# Patient Record
Sex: Female | Born: 1980 | Race: White | Hispanic: Yes | Marital: Married
Health system: Southern US, Community
[De-identification: ages and names within clinical notes are randomized; demographics above are authoritative.]

---

## 2005-04-26 ENCOUNTER — Encounter: Admission: RE | Admit: 2005-04-26 | Discharge: 2005-04-26 | Payer: Self-pay | Admitting: Internal Medicine

## 2005-05-20 ENCOUNTER — Other Ambulatory Visit: Admission: RE | Admit: 2005-05-20 | Discharge: 2005-05-20 | Payer: Self-pay | Admitting: Obstetrics & Gynecology

## 2006-01-11 ENCOUNTER — Other Ambulatory Visit: Admission: RE | Admit: 2006-01-11 | Discharge: 2006-01-11 | Payer: Self-pay | Admitting: Obstetrics & Gynecology

## 2006-05-11 IMAGING — US US PELVIS COMPLETE
1 series · 14 of 25 positions shown · non-contrast
Comparison: none

CLINICAL DATA: Pelvic pain, abdominal pain.
 TRANSABDOMINAL AND TRANSVAGINAL PELVIC ULTRASOUND:
TECHNIQUE: Both transabdominal and transvaginal ultrasound examinations of the pelvis were performed, including evaluation of the uterus, ovaries, adnexal regions, and pelvic cul-de-sac.
 The uterus is normal in size measuring 8.4 cm sagittally with a depth of 4.5 cm and width of 5.0 cm.  The endometrium is within normal limits measuring 13 mm in thickness and appearing homogeneous.  No myometrial abnormality is seen.  The ovaries are normal in size.  There is an apparent complex cyst on the right of 2.3 cm. Only trace amount of fluid is noted in the pelvis.

[Series 1: unknown · 0.22mm/px · 14 of 63 slices shown]
[im 1/63]
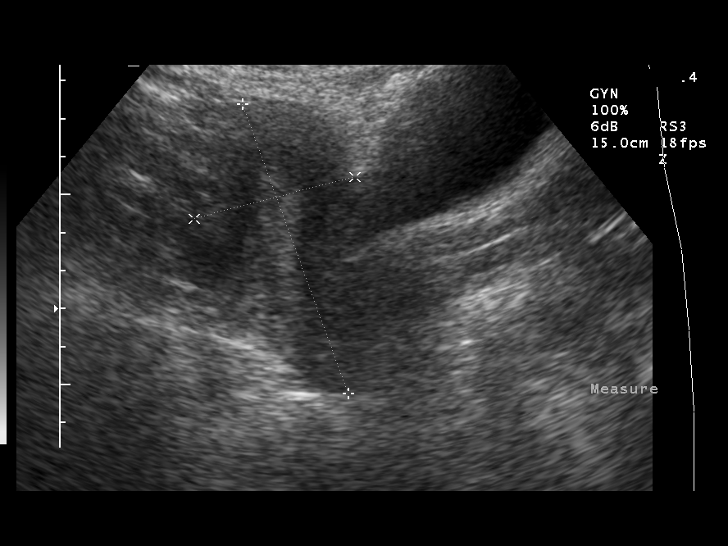
[im 6/63]
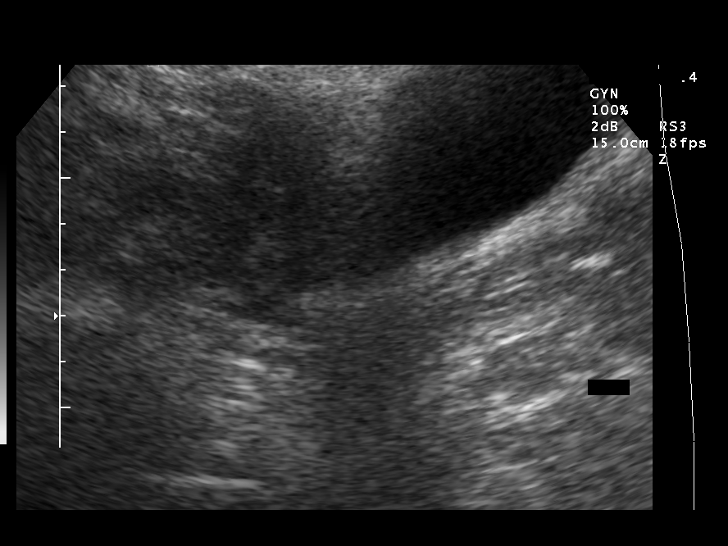
[im 11/63]
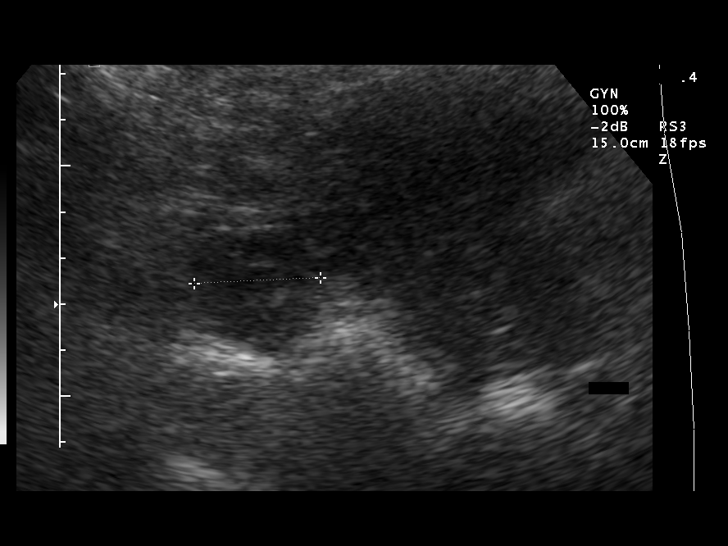
[im 16/63]
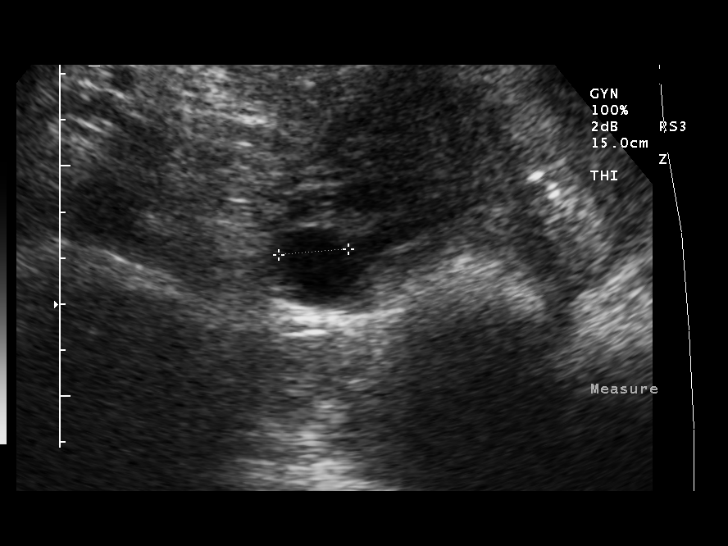
[im 21/63]
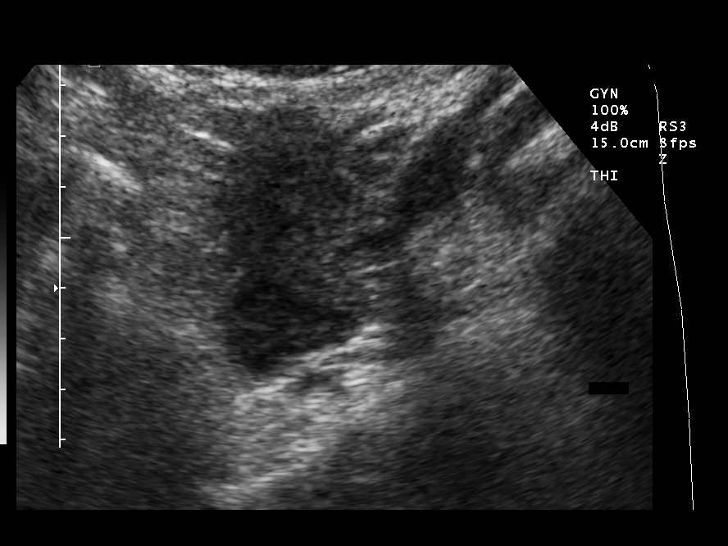
[im 24/63]
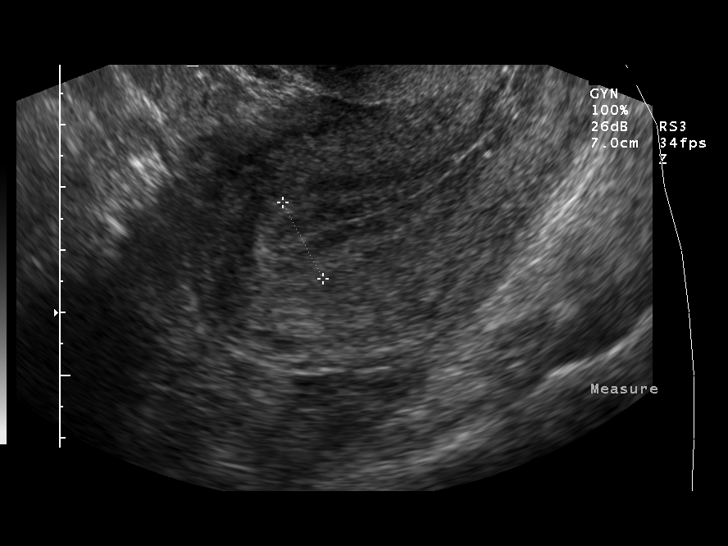
[im 29/63]
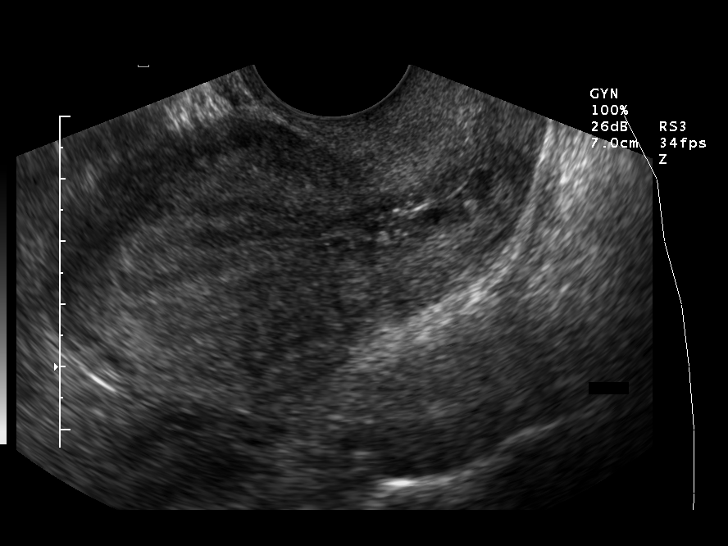
[im 34/63]
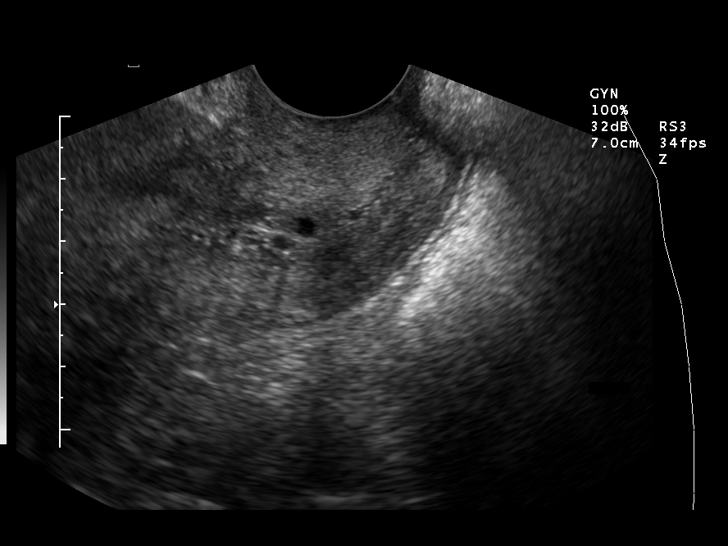
[im 39/63]
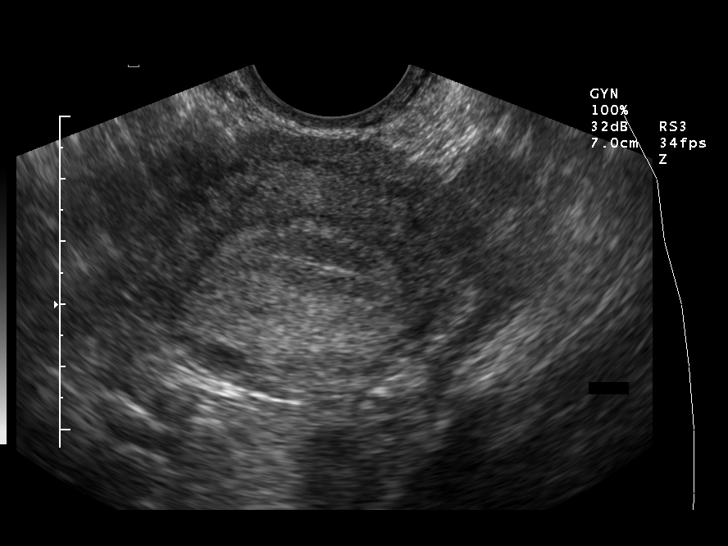
[im 42/63]
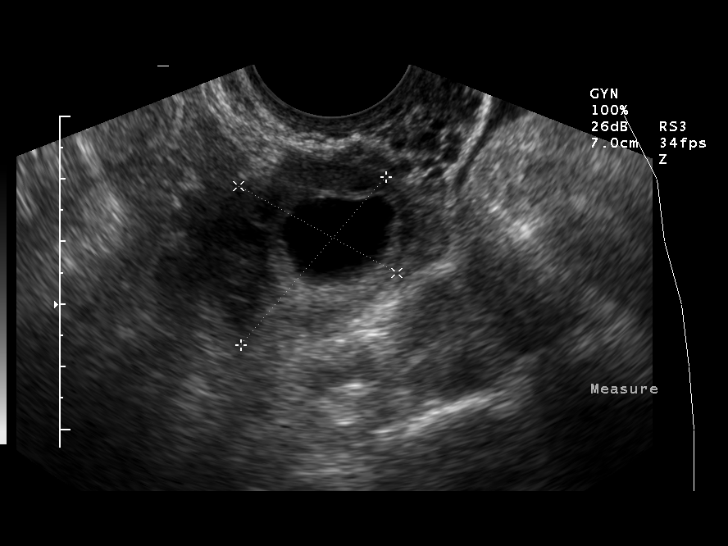
[im 47/63]
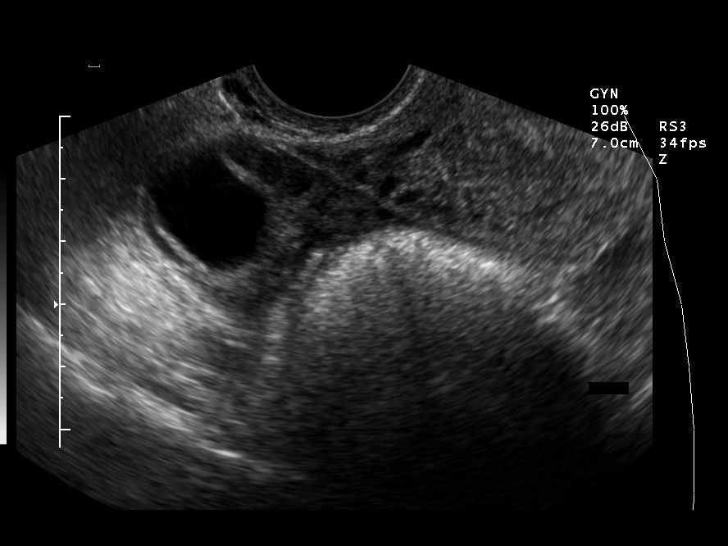
[im 52/63]
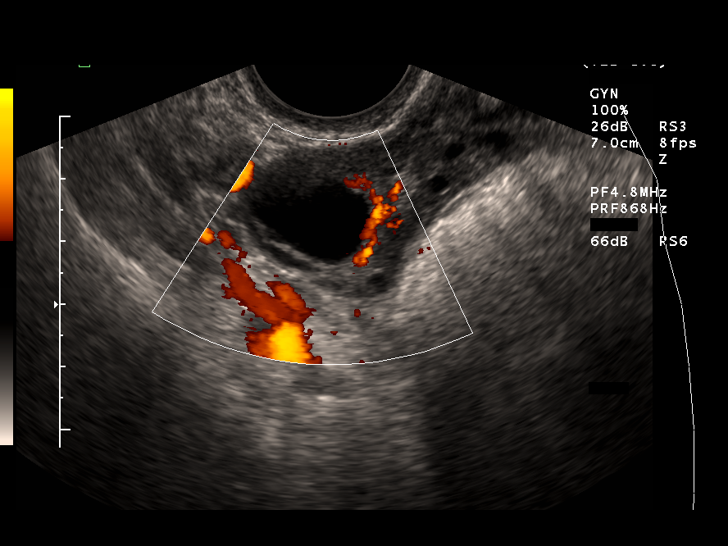
[im 57/63]
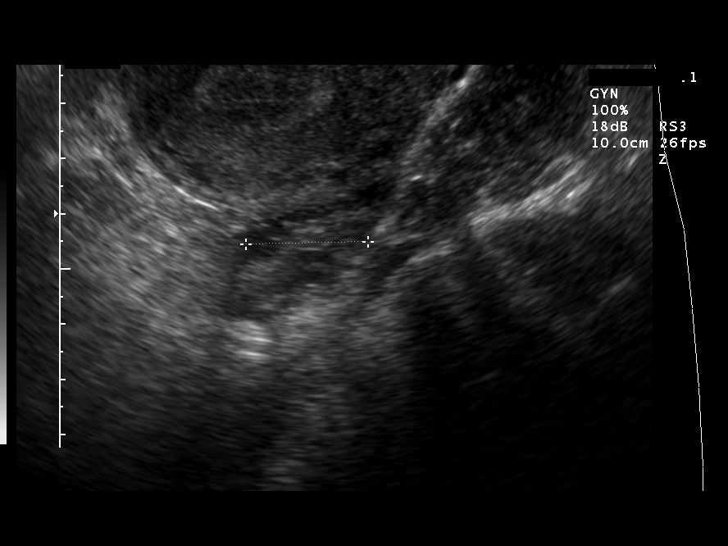
[im 63/63]
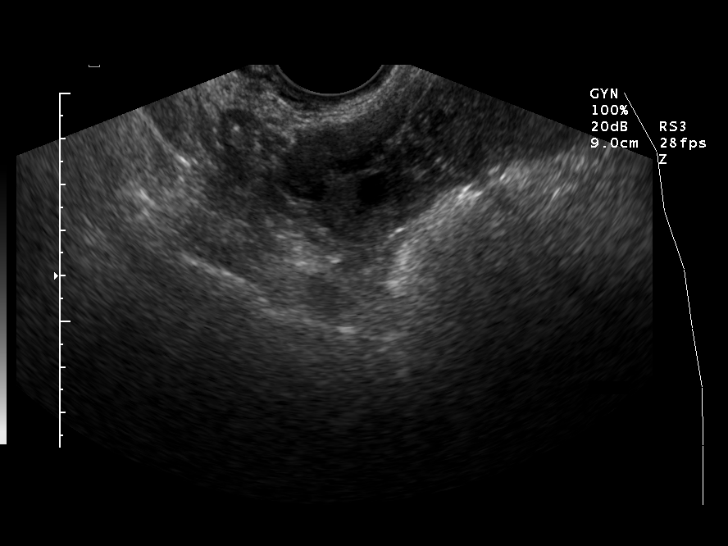

[14 of 25 positions shown; findings below may reference images not displayed]

IMPRESSION: Uterus and ovaries are within normal limits with 2.3 cm cyst on right.

## 2006-08-03 ENCOUNTER — Other Ambulatory Visit: Admission: RE | Admit: 2006-08-03 | Discharge: 2006-08-03 | Payer: Self-pay | Admitting: Obstetrics & Gynecology

## 2011-06-24 ENCOUNTER — Encounter (INDEPENDENT_AMBULATORY_CARE_PROVIDER_SITE_OTHER): Payer: Self-pay | Admitting: Obstetrics and Gynecology

## 2011-06-24 DIAGNOSIS — N926 Irregular menstruation, unspecified: Secondary | ICD-10-CM

## 2011-06-24 DIAGNOSIS — N760 Acute vaginitis: Secondary | ICD-10-CM

## 2011-07-19 ENCOUNTER — Other Ambulatory Visit (INDEPENDENT_AMBULATORY_CARE_PROVIDER_SITE_OTHER): Payer: Self-pay

## 2011-07-19 ENCOUNTER — Encounter (INDEPENDENT_AMBULATORY_CARE_PROVIDER_SITE_OTHER): Payer: Self-pay | Admitting: Obstetrics and Gynecology

## 2011-07-19 DIAGNOSIS — N949 Unspecified condition associated with female genital organs and menstrual cycle: Secondary | ICD-10-CM

## 2011-07-19 DIAGNOSIS — N925 Other specified irregular menstruation: Secondary | ICD-10-CM

## 2014-08-01 ENCOUNTER — Telehealth (HOSPITAL_COMMUNITY): Payer: Self-pay | Admitting: *Deleted

## 2014-08-01 NOTE — Telephone Encounter (Signed)
Preadmission screen  

## 2014-08-04 ENCOUNTER — Inpatient Hospital Stay (HOSPITAL_COMMUNITY): Admission: RE | Admit: 2014-08-04 | Payer: Self-pay | Source: Ambulatory Visit
